# Patient Record
Sex: Female | Born: 1980 | Race: White | Hispanic: No | Marital: Married | Smoking: Never smoker
Health system: Southern US, Community
[De-identification: ages and names within clinical notes are randomized; demographics above are authoritative.]

---

## 2017-08-23 ENCOUNTER — Other Ambulatory Visit: Payer: Self-pay

## 2017-08-23 ENCOUNTER — Encounter (HOSPITAL_BASED_OUTPATIENT_CLINIC_OR_DEPARTMENT_OTHER): Payer: Self-pay | Admitting: Emergency Medicine

## 2017-08-23 ENCOUNTER — Emergency Department (HOSPITAL_BASED_OUTPATIENT_CLINIC_OR_DEPARTMENT_OTHER)
Admission: EM | Admit: 2017-08-23 | Discharge: 2017-08-24 | Disposition: A | Discharge status: Home / Self Care | Payer: No Typology Code available for payment source | Attending: Emergency Medicine | Admitting: Emergency Medicine

## 2017-08-23 ENCOUNTER — Emergency Department (HOSPITAL_BASED_OUTPATIENT_CLINIC_OR_DEPARTMENT_OTHER): Payer: No Typology Code available for payment source

## 2017-08-23 DIAGNOSIS — Y9301 Activity, walking, marching and hiking: Secondary | ICD-10-CM | POA: Diagnosis not present

## 2017-08-23 DIAGNOSIS — Y990 Civilian activity done for income or pay: Secondary | ICD-10-CM | POA: Diagnosis not present

## 2017-08-23 DIAGNOSIS — S63501A Unspecified sprain of right wrist, initial encounter: Secondary | ICD-10-CM

## 2017-08-23 DIAGNOSIS — Y929 Unspecified place or not applicable: Secondary | ICD-10-CM | POA: Diagnosis not present

## 2017-08-23 DIAGNOSIS — W228XXA Striking against or struck by other objects, initial encounter: Secondary | ICD-10-CM | POA: Insufficient documentation

## 2017-08-23 DIAGNOSIS — S6991XA Unspecified injury of right wrist, hand and finger(s), initial encounter: Secondary | ICD-10-CM | POA: Diagnosis present

## 2017-08-23 NOTE — ED Triage Notes (Signed)
Patient states that she was unloading something at work and she hit her right wrist  - the patient reports that she continues to have pain to her wrist

## 2017-08-24 MED ORDER — NAPROXEN 375 MG PO TABS: ORAL_TABLET | ORAL | 0 refills | Status: AC

## 2017-08-24 MED ORDER — NAPROXEN 250 MG PO TABS
500.0000 mg | ORAL_TABLET | Freq: Once | ORAL | Status: AC
  Administered 2017-08-24: 500 mg via ORAL
  Filled 2017-08-24: qty 2

## 2017-08-24 NOTE — ED Provider Notes (Signed)
[facility] Provider Note: Shawna Dell, MD, FACEP  CSN: [id number] MRN: [id number] ARRIVAL: 08/23/17 at 2151 ROOM: [facility]   CHIEF COMPLAINT  Wrist Injury   HISTORY OF PRESENT ILLNESS  08/24/17 12:35 AM Shawna Little is a 37 y.o. female who struck her right wrist at work 4 days ago.  She is having moderate pain in her right wrist along the ulnar aspect of the right wrist.  Pain is worse with movement.  Pain radiates to her right fifth finger.  There is no associated deformity, swelling or discoloration.  She has been taking ibuprofen and Tylenol without adequate relief of the pain.  She denies other injury.   History reviewed. No pertinent past medical history.  History reviewed. No pertinent surgical history.  History reviewed. No pertinent family history.  Social History   Tobacco Use  . Smoking status: Never Smoker  . Smokeless tobacco: Never Used  Substance Use Topics  . Alcohol use: Never    Frequency: Never  . Drug use: Never    Prior to Admission medications   Not on File    Allergies Patient has no known allergies.   REVIEW OF SYSTEMS  Negative except as noted here or in the History of Present Illness.   PHYSICAL EXAMINATION  Initial Vital Signs Blood pressure (!) 136/99, pulse (!) 110, temperature 98.7 F (37.1 C), temperature source Oral, resp. rate 18, height 5\' 6"  (1.676 m), weight 54 kg (119 lb), last menstrual period 08/08/2017, SpO2 100 %.  Examination General: Well-developed, well-nourished female in no acute distress; appearance consistent with age of record HENT: normocephalic; atraumatic Eyes: pupils equal, round and reactive to light; extraocular muscles intact Neck: supple Heart: regular rate and rhythm Lungs: clear to auscultation bilaterally Abdomen: soft; nondistended; nontender; bowel sounds present Extremities: No deformity; tenderness of right wrist and over the right ulnar collateral ligament without erythema,  swelling or ecchymosis, right hand distally neurovascularly intact with intact tendon function Neurologic: Awake, alert and oriented; motor function intact in all extremities and symmetric; no facial droop Skin: Warm and dry Psychiatric: Normal mood and affect   RESULTS  Summary of this visit's results, reviewed by myself:   EKG Interpretation  Date/Time:    Ventricular Rate:    PR Interval:    QRS Duration:   QT Interval:    QTC Calculation:   R Axis:     Text Interpretation:        Laboratory Studies: No results found for this or any previous visit (from the past 24 hour(s)). Imaging Studies: Dg Wrist Complete Right  Result Date: 08/23/2017 CLINICAL DATA:  Initial evaluation for acute pain status post trauma, fall. EXAM: RIGHT WRIST - COMPLETE 3+ VIEW COMPARISON:  None. FINDINGS: There is no evidence of fracture or dislocation. There is no evidence of arthropathy or other focal bone abnormality. Soft tissues are unremarkable. IMPRESSION: Negative. Electronically Signed   By: Rise Mu M.D.   On: 08/23/2017 22:23    ED COURSE and MDM  Nursing notes and initial vitals signs, including pulse oximetry, reviewed.  Vitals:   08/23/17 2202 08/23/17 2203  BP: (!) 136/99   Pulse: (!) 110   Resp: 18   Temp: 98.7 F (37.1 C)   TempSrc: Oral   SpO2: 100%   Weight:  54 kg (119 lb)  Height:  5\' 6"  (1.676 m)   Examination consistent with a sprain of the right wrist, notably the ulnar collateral ligament.  We will splint and treat  with naproxen.  PROCEDURES    ED DIAGNOSES     ICD-10-CM   1. Sprain of right wrist, initial encounter S63.501A        Paula LibraMolpus, Skylan Lara, MD 08/24/17 [phone]

## 2017-09-17 ENCOUNTER — Ambulatory Visit: Payer: Medicaid Other | Admitting: Family Medicine

## 2018-11-18 IMAGING — CR DG WRIST COMPLETE 3+V*R*
4 series · 4 of 4 positions shown · non-contrast
Comparison: None.

CLINICAL DATA: Initial evaluation for acute pain status post
trauma, fall.

EXAM:
RIGHT WRIST - COMPLETE 3+ VIEW

[x wrist pa right]
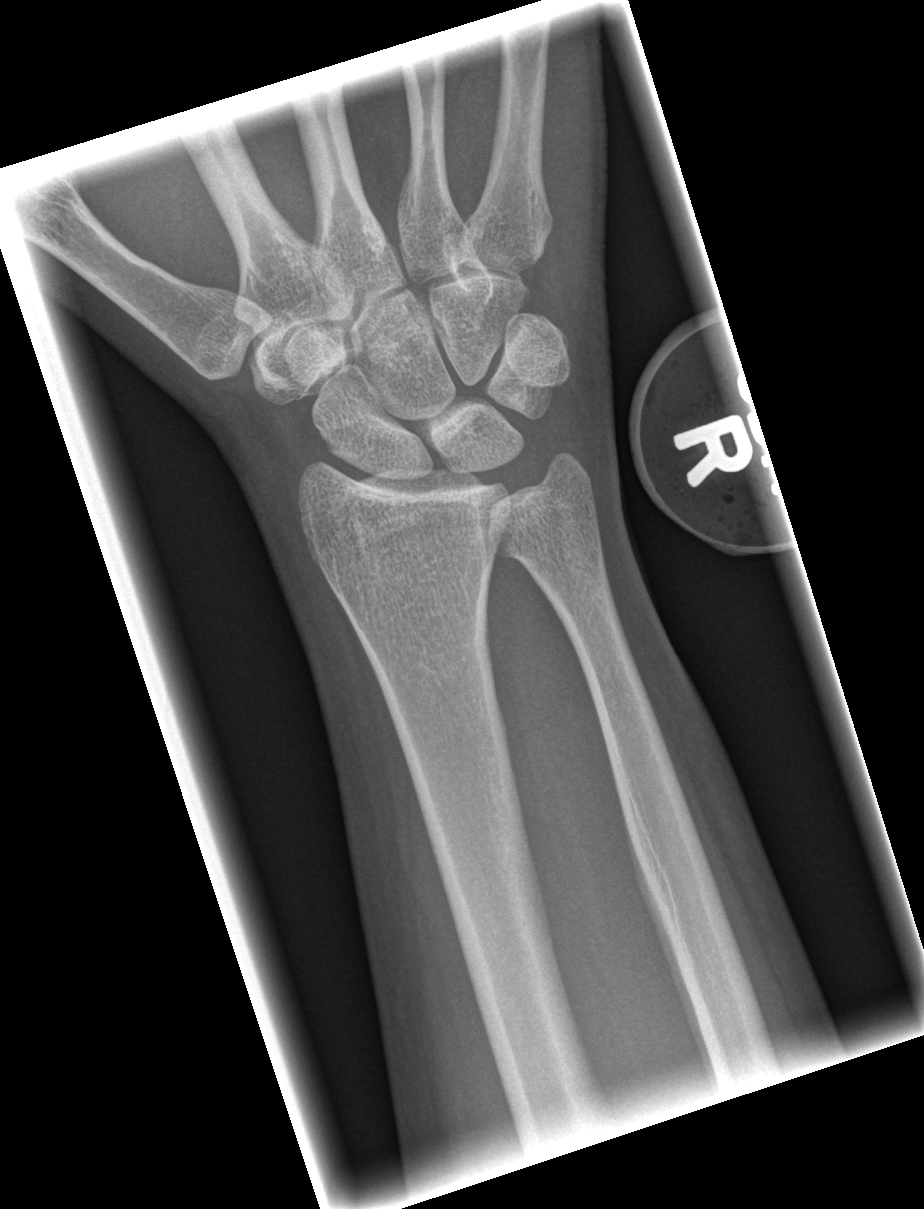

[x wrist obl right]
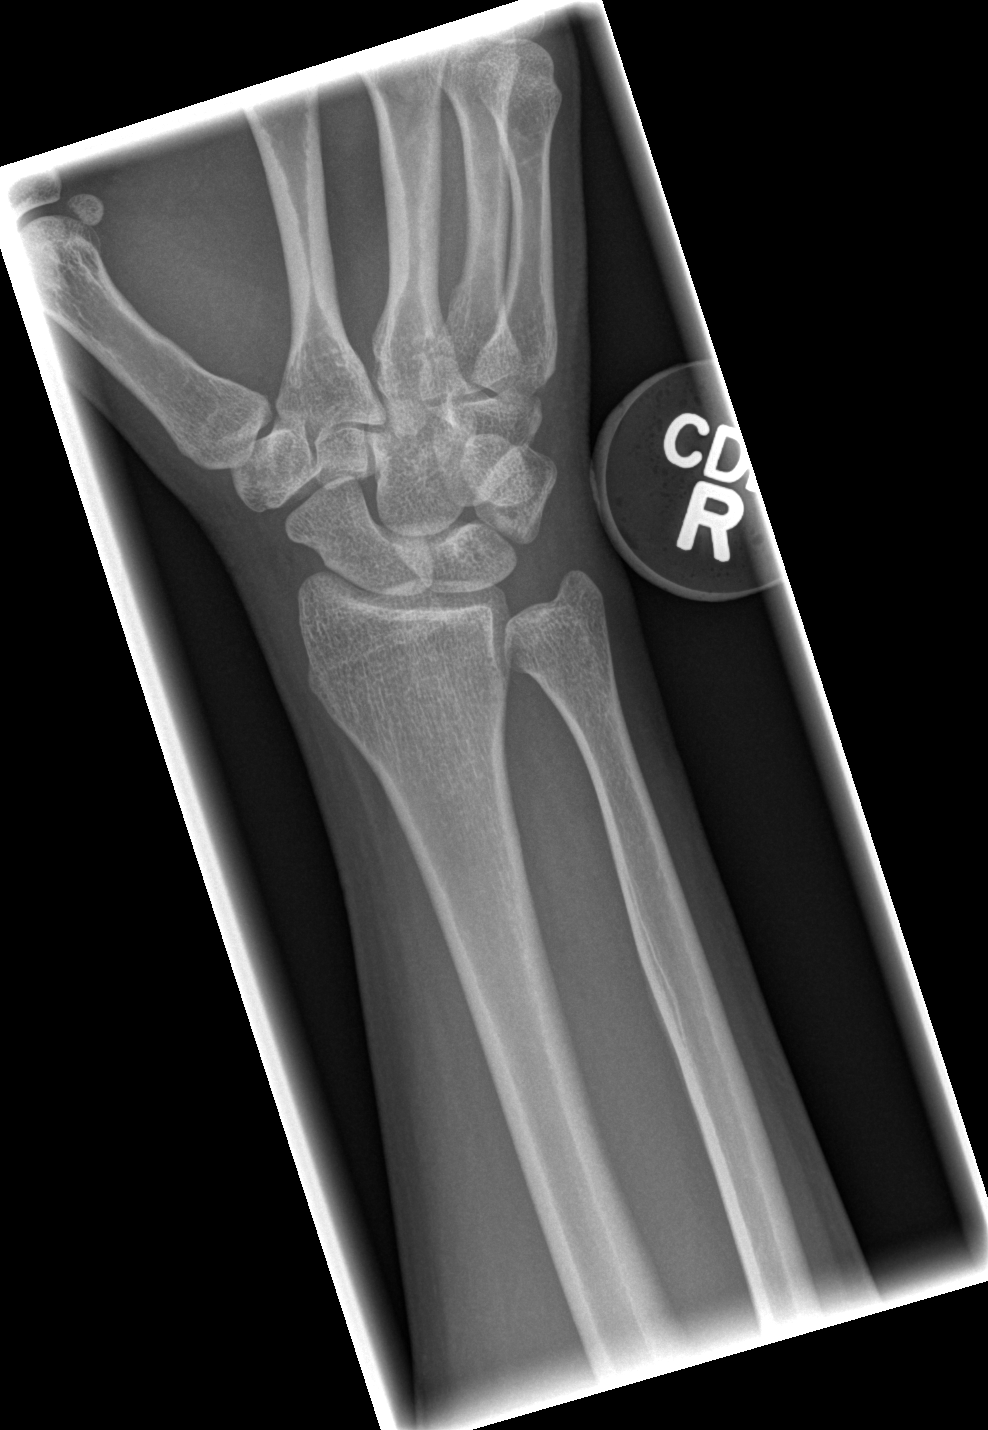

[x wrist lat right]
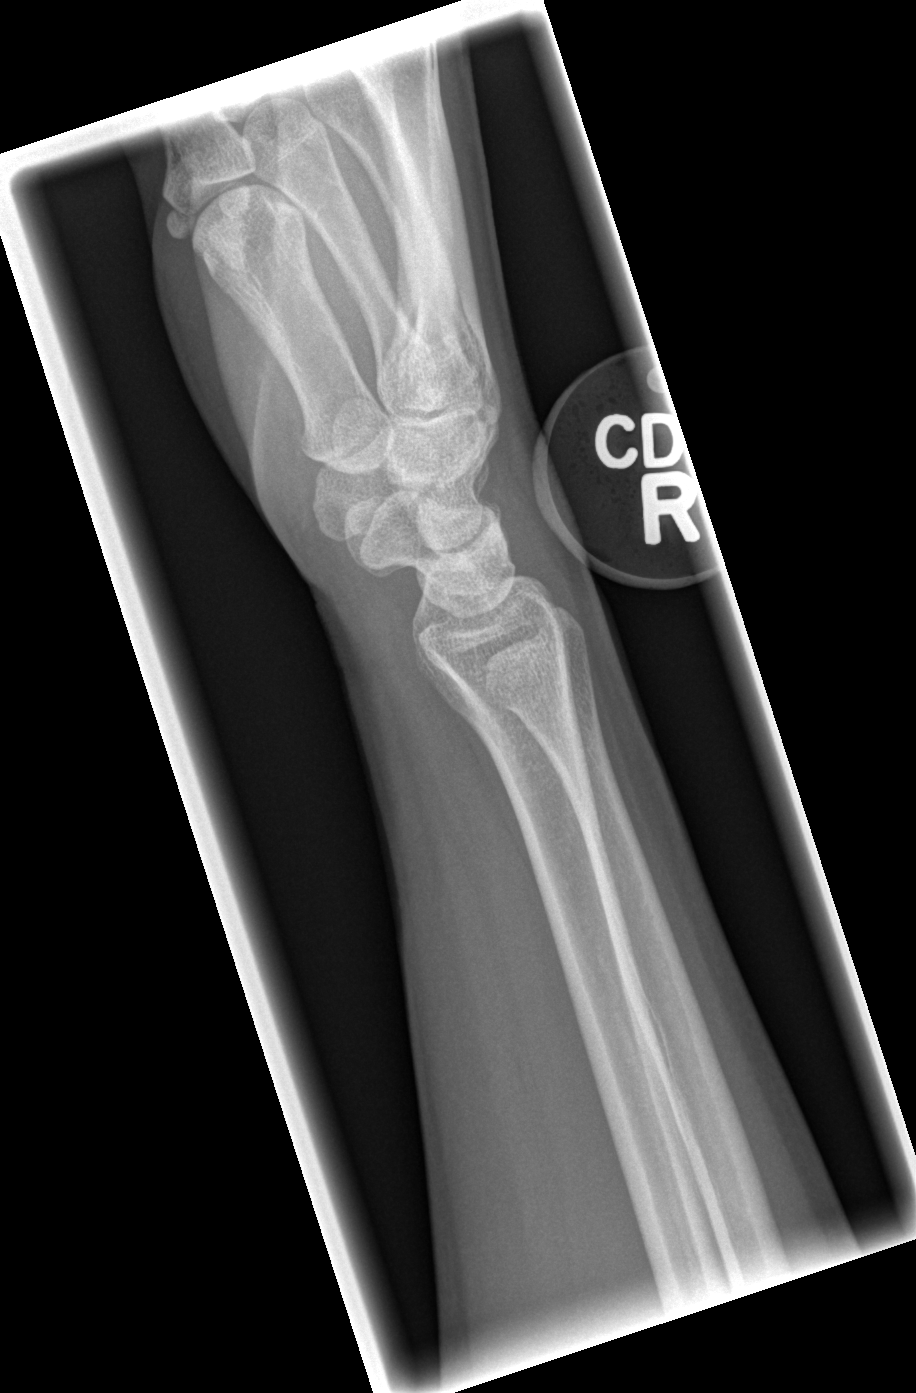

[x navicular]
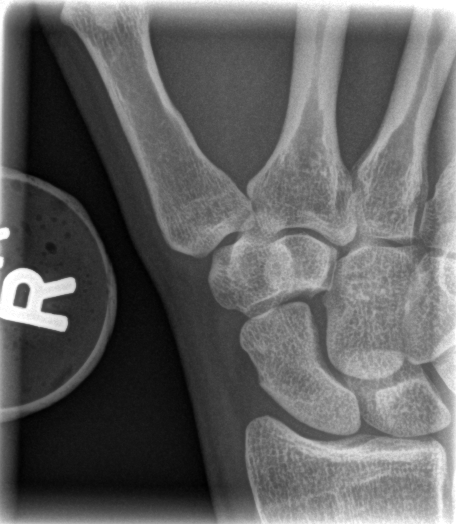

[4 of 4 positions shown; findings below may reference images not displayed]

FINDINGS: There is no evidence of fracture or dislocation. There is no
evidence of arthropathy or other focal bone abnormality. Soft
tissues are unremarkable.
IMPRESSION: Negative.
